# Patient Record
Sex: Female | Born: 2000 | Race: Black or African American | Hispanic: No | Marital: Single | Smoking: Never smoker
Health system: Southern US, Community
[De-identification: ages and names within clinical notes are randomized; demographics above are authoritative.]

## PROBLEM LIST (undated history)

## (undated) DIAGNOSIS — J45909 Unspecified asthma, uncomplicated: Secondary | ICD-10-CM

## (undated) DIAGNOSIS — D649 Anemia, unspecified: Secondary | ICD-10-CM

---
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
## 2022-01-04 DIAGNOSIS — R739 Hyperglycemia, unspecified: Secondary | ICD-10-CM | POA: Diagnosis not present

## 2023-12-25 ENCOUNTER — Encounter (HOSPITAL_COMMUNITY): Payer: Self-pay

## 2023-12-25 ENCOUNTER — Emergency Department (HOSPITAL_COMMUNITY)
Admission: EM | Admit: 2023-12-25 | Discharge: 2023-12-26 | Disposition: A | Discharge status: Home / Self Care | Attending: Emergency Medicine | Admitting: Emergency Medicine

## 2023-12-25 DIAGNOSIS — R1030 Lower abdominal pain, unspecified: Secondary | ICD-10-CM | POA: Insufficient documentation

## 2023-12-25 DIAGNOSIS — R197 Diarrhea, unspecified: Secondary | ICD-10-CM | POA: Diagnosis present

## 2023-12-25 DIAGNOSIS — J45909 Unspecified asthma, uncomplicated: Secondary | ICD-10-CM | POA: Insufficient documentation

## 2023-12-25 DIAGNOSIS — Z7951 Long term (current) use of inhaled steroids: Secondary | ICD-10-CM | POA: Diagnosis not present

## 2023-12-25 HISTORY — DX: Unspecified asthma, uncomplicated: J45.909

## 2023-12-25 HISTORY — DX: Anemia, unspecified: D64.9

## 2023-12-25 LAB — COMPREHENSIVE METABOLIC PANEL WITH GFR
ALT: 15 U/L (ref 0–44)
AST: 19 U/L (ref 15–41)
Albumin: 3.6 g/dL (ref 3.5–5.0)
Alkaline Phosphatase: 56 U/L (ref 38–126)
Anion gap: 11 (ref 5–15)
BUN: 15 mg/dL (ref 6–20)
CO2: 22 mmol/L (ref 22–32)
Calcium: 8.8 mg/dL — ABNORMAL LOW (ref 8.9–10.3)
Chloride: 103 mmol/L (ref 98–111)
Creatinine, Ser: 0.84 mg/dL (ref 0.44–1.00)
GFR, Estimated: >60 mL/min (ref >60)
Glucose, Bld: 93 mg/dL (ref 70–99)
Potassium: 3.8 mmol/L (ref 3.5–5.1)
Sodium: 136 mmol/L (ref 135–145)
Total Bilirubin: 0.5 mg/dL (ref 0.0–1.2)
Total Protein: 7.4 g/dL (ref 6.5–8.1)

## 2023-12-25 LAB — URINALYSIS, ROUTINE W REFLEX MICROSCOPIC
Bilirubin Urine: NEGATIVE
Glucose, UA: NEGATIVE mg/dL
Hgb urine dipstick: NEGATIVE
Ketones, ur: NEGATIVE mg/dL
Nitrite: NEGATIVE
Protein, ur: NEGATIVE mg/dL
Specific Gravity, Urine: 1.028 (ref 1.005–1.030)
pH: 5 (ref 5.0–8.0)

## 2023-12-25 LAB — CBC
HCT: 42.9 % (ref 36.0–46.0)
Hemoglobin: 13.9 g/dL (ref 12.0–15.0)
MCH: 27.7 pg (ref 26.0–34.0)
MCHC: 32.4 g/dL (ref 30.0–36.0)
MCV: 85.6 fL (ref 80.0–100.0)
Platelets: 214 10*3/uL (ref 150–400)
RBC: 5.01 MIL/uL (ref 3.87–5.11)
RDW: 13.6 % (ref 11.5–15.5)
WBC: 6 10*3/uL (ref 4.0–10.5)
nRBC: 0 % (ref 0.0–0.2)

## 2023-12-25 LAB — HCG, SERUM, QUALITATIVE: Preg, Serum: NEGATIVE

## 2023-12-25 LAB — LIPASE, BLOOD: Lipase: 26 U/L (ref 11–51)

## 2023-12-25 NOTE — ED Triage Notes (Signed)
 Pt c/o low back pain, low abdominal pain, and diarrhea x3 days.  Pain score 5/10.  Pt reports she thought it was just PMS, until the diarrhea started.   Pt reports she has been traveling frequently including to [place] and [place].

## 2023-12-25 NOTE — ED Provider Triage Note (Signed)
 Emergency Medicine Provider Triage Evaluation Note  Dawn Nash , a 23 y.o. female  was evaluated in triage.  Pt complains of nausea, abdominal pain has been ongoing for the past 3 days along with diarrhea.  Reports some low back pain, shoulder pain, generalized bodyaches.  Nash menstrual period was at the beginning of the month.  No fever, no PCP that she sees.  Review of Systems  Positive: Nausea, abdominal pain, diarrhea Negative: Vomiting, fever  Physical Exam  BP 131/77 (BP Location: Right Arm)   Pulse 71   Temp 98.3 F (36.8 C)   Resp 16   Ht 5\' 9"  (1.753 m)   Wt 62.6 kg   LMP 12/04/2023   SpO2 100%   BMI 20.38 kg/m  Gen:   Awake, no distress   Resp:  Normal effort  MSK:   Moves extremities without difficulty  Other:    Medical Decision Making  Medically screening exam initiated at 6:27 PM.  Appropriate orders placed.  Dawn Nash was informed that the remainder of the evaluation will be completed by another provider, this initial triage assessment does not replace that evaluation, and the importance of remaining in the ED until their evaluation is complete.     Chania Kochanski, PA-C 12/25/23 1829

## 2023-12-26 ENCOUNTER — Other Ambulatory Visit (HOSPITAL_COMMUNITY): Payer: Self-pay

## 2023-12-26 MED ORDER — ONDANSETRON HCL 4 MG PO TABS
4.0000 mg | ORAL_TABLET | Freq: Three times a day (TID) | ORAL | 0 refills | Status: AC | PRN
  Filled 2023-12-26: qty 12, 4d supply, fill #0

## 2023-12-26 NOTE — ED Notes (Signed)
 Pt in bed, family at bedside, pt states that she has been traveling a lot, pt reports some diarrhea, denies nausea or vomiting.  Pt abd is slightly tender, pt states that she has some pain, but states that she doesn't want anything for pain at this time.  Resps even and unlabored, pt has no further requests at this time

## 2023-12-26 NOTE — Discharge Instructions (Addendum)
 Your diarrhea is likely due to a viral process, this will improve over time.  Stay hydrated, eating bland food will help.  You may take Zofran as needed for nausea.  If you develop bloody diarrhea, severe abdominal discomfort, do not hesitate to return for further care.

## 2023-12-26 NOTE — ED Notes (Signed)
 Pt in bed with eyes closed, pt arouses easily to verbal stim, pt states that she is ready to go home, pt verbalized understanding d/c and follow up, script reviewed, work note given, pt ambulatory from department.

## 2023-12-26 NOTE — ED Provider Notes (Signed)
 [place] EMERGENCY DEPARTMENT AT [facility] Provider Note   CSN: [id number] Arrival date & time: 12/25/23  1743     History  Chief Complaint  Patient presents with   Abdominal Pain   Diarrhea    [name] is a 23 y.o. female.  The history is provided by the patient and medical records. No language interpreter was used.  Abdominal Pain Associated symptoms: diarrhea   Diarrhea Associated symptoms: abdominal pain      23 year old female history of anemia, asthma, presenting with complaint of diarrhea.  Patient report for the past 3 days she noticed some pain across her lower abdomen and her back with persistent nonbloody diarrhea.  She also endorsed some nausea without any vomiting.  No associated fever or chills no chest pain or shortness of breath no dysuria.  She recently started her menstruation.  She also travel frequently from [place] and [address].  She mention while waiting in the throughout the day today her symptom has improved and her diarrhea has lessened.  She denies any recent antibiotic use.  Home Medications Prior to Admission medications   Medication Sig Start Date End Date Taking? Authorizing Provider  cetirizine  (ZYRTEC  ALLERGY) 10 MG tablet Take 1 tablet (10 mg total) by mouth daily. 12/03/17   Palumbo, April, MD  fluticasone  (FLONASE ) 50 MCG/ACT nasal spray Place 2 sprays into both nostrils daily. 12/03/17   Palumbo, April, MD      Allergies    Patient has no known allergies.    Review of Systems   Review of Systems  Gastrointestinal:  Positive for abdominal pain and diarrhea.  All other systems reviewed and are negative.   Physical Exam Updated Vital Signs BP 119/82 (BP Location: Left Arm)   Pulse (!) 55   Temp 98.6 F (37 C)   Resp 16   Ht 5\' 9"  (1.753 m)   Wt 62.6 kg   LMP 12/04/2023   SpO2 91%   BMI 20.38 kg/m  Physical Exam Vitals and nursing note reviewed.  Constitutional:      General: She is not in acute  distress.    Appearance: She is well-developed.  HENT:     Head: Atraumatic.  Eyes:     Conjunctiva/sclera: Conjunctivae normal.  Cardiovascular:     Rate and Rhythm: Normal rate and regular rhythm.  Pulmonary:     Effort: Pulmonary effort is normal.  Abdominal:     General: Bowel sounds are normal.     Palpations: Abdomen is soft.     Tenderness: There is no abdominal tenderness. There is no right CVA tenderness or left CVA tenderness.  Musculoskeletal:     Cervical back: Neck supple.  Skin:    Findings: No rash.  Neurological:     Mental Status: She is alert.  Psychiatric:        Mood and Affect: Mood normal.     ED Results / Procedures / Treatments   Labs (all labs ordered are listed, but only abnormal results are displayed) Labs Reviewed  COMPREHENSIVE METABOLIC PANEL WITH GFR - Abnormal; Notable for the following components:      Result Value   Calcium 8.8 (*)    All other components within normal limits  URINALYSIS, ROUTINE W REFLEX MICROSCOPIC - Abnormal; Notable for the following components:   APPearance HAZY (*)    Leukocytes,Ua TRACE (*)    Bacteria, UA RARE (*)    All other components within normal limits  LIPASE, BLOOD  CBC  HCG, SERUM, QUALITATIVE    EKG None  Radiology No results found.  Procedures Procedures    Medications Ordered in ED Medications - No data to display  ED Course/ Medical Decision Making/ A&P                                 Medical Decision Making Amount and/or Complexity of Data Reviewed Labs: ordered.  Risk Prescription drug management.   BP 119/82 (BP Location: Left Arm)   Pulse (!) 55   Temp 98.6 F (37 C)   Resp 16   Ht 5\' 9"  (1.753 m)   Wt 62.6 kg   LMP 12/04/2023   SpO2 91%   BMI 20.38 kg/m   48:22 AM  23 year old female history of anemia, asthma, presenting with complaint of diarrhea.  Patient report for the past 3 days she noticed some pain across her lower abdomen and her back with persistent  nonbloody diarrhea.  She also endorsed some nausea without any vomiting.  No associated fever or chills no chest pain or shortness of breath no dysuria.  She recently started her menstruation.  She also travel frequently from [place] and [place].  She mention while waiting in the throughout the day today her symptom has improved and her diarrhea has lessened.  She denies any recent antibiotic use.  On exam, patient is resting comfortably appears to be in no acute discomfort.  Normal oral mucosa, normal skin turgor, no significant abdominal tenderness back tenderness on palpation and percussion.  Bowel sounds are present.  -Labs ordered, independently viewed and interpreted by me.  Labs remarkable for reassuring labs no electrolyte derangement normal WBC pregnancy test is negative. -The patient was maintained on a cardiac monitor.  I personally viewed and interpreted the cardiac monitored which showed an underlying rhythm of: Sinus bradycardia -Imaging including abdominal pelvis CT scan considered but not performed as I have low suspicion for infectious colitis or other acute abdominal pathology -This patient presents to the ED for concern of diarrhea, this involves an extensive number of treatment options, and is a complaint that carries with it a high risk of complications and morbidity.  The differential diagnosis includes traveler's diarrhea, functional diarrhea, food intolerance, colitis, diverticulitis, C. Difficile, malobsorbtion -Co morbidities that complicate the patient evaluation includes anemia, asthma -Treatment includes monitoring -Reevaluation of the patient after these medicines showed that the patient improved -PCP office notes or outside notes reviewed -Escalation to admission/observation considered: patients feels much better, is comfortable with discharge, and will follow up with PCP -Prescription medication considered, patient comfortable with Zofran as needed for  nausea -Social Determinant of Health considered          Final Clinical Impression(s) / ED Diagnoses Final diagnoses:  Diarrhea, unspecified type    Rx / DC Orders ED Discharge Orders          Ordered    ondansetron (ZOFRAN) 4 MG tablet  Every 8 hours PRN        12/26/23 0722              Debbra Fairy, PA-C 12/26/23 0723    Long, Shereen Dike, MD 12/26/23 [phone]

## 2024-01-07 ENCOUNTER — Other Ambulatory Visit (HOSPITAL_COMMUNITY): Payer: Self-pay
